# Patient Record
Sex: Male | Born: 1995 | Race: Black or African American | Hispanic: No | Marital: Single | Smoking: Never smoker
Health system: Southern US, Community
[De-identification: ages and names within clinical notes are randomized; demographics above are authoritative.]

## PROBLEM LIST (undated history)

## (undated) DIAGNOSIS — S93409A Sprain of unspecified ligament of unspecified ankle, initial encounter: Secondary | ICD-10-CM

## (undated) DIAGNOSIS — T7840XA Allergy, unspecified, initial encounter: Secondary | ICD-10-CM

## (undated) HISTORY — DX: Sprain of unspecified ligament of unspecified ankle, initial encounter: S93.409A

## (undated) HISTORY — DX: Allergy, unspecified, initial encounter: T78.40XA

## (undated) HISTORY — PX: CHALAZION EXCISION: SHX213

---
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
## 2013-02-21 ENCOUNTER — Ambulatory Visit: Payer: Self-pay | Admitting: Family Medicine

## 2013-08-23 ENCOUNTER — Emergency Department: Payer: Self-pay | Admitting: Emergency Medicine

## 2013-08-23 LAB — COMPREHENSIVE METABOLIC PANEL
Albumin: 4.2 g/dL (ref 3.8–5.6)
Alkaline Phosphatase: 142 U/L (ref 98–317)
Anion Gap: 3 — ABNORMAL LOW (ref 7–16)
BUN: 18 mg/dL (ref 9–21)
Bilirubin,Total: 0.5 mg/dL (ref 0.2–1.0)
Calcium, Total: 9.5 mg/dL (ref 9.0–10.7)
Chloride: 104 mmol/L (ref 97–107)
Co2: 30 mmol/L — ABNORMAL HIGH (ref 16–25)
Creatinine: 1.3 mg/dL (ref 0.60–1.30)
EGFR (African American): >60
EGFR (Non-African Amer.): >60
Glucose: 77 mg/dL (ref 65–99)
Osmolality: 275 (ref 275–301)
Potassium: 4.1 mmol/L (ref 3.3–4.7)
SGOT(AST): 40 U/L (ref 10–41)
SGPT (ALT): 24 U/L (ref 12–78)
Sodium: 137 mmol/L (ref 132–141)
Total Protein: 7.9 g/dL (ref 6.4–8.6)

## 2013-08-23 LAB — URINALYSIS, COMPLETE
Bacteria: NONE SEEN
Bilirubin,UR: NEGATIVE
Blood: NEGATIVE
Glucose,UR: NEGATIVE mg/dL (ref 0–75)
Ketone: NEGATIVE
Leukocyte Esterase: NEGATIVE
Nitrite: NEGATIVE
Ph: 6 (ref 4.5–8.0)
Protein: NEGATIVE
RBC,UR: <1 /HPF (ref 0–5)
Specific Gravity: 1.03 (ref 1.003–1.030)
Squamous Epithelial: NONE SEEN
WBC UR: <1 /HPF (ref 0–5)

## 2013-08-23 LAB — CBC
HCT: 43.9 % (ref 40.0–52.0)
MCHC: 33.4 g/dL (ref 32.0–36.0)
MCV: 83 fL (ref 80–100)
Platelet: 226 10*3/uL (ref 150–440)
RBC: 5.31 10*6/uL (ref 4.40–5.90)

## 2013-08-23 LAB — LIPASE, BLOOD: Lipase: 82 U/L (ref 73–393)

## 2014-02-16 ENCOUNTER — Ambulatory Visit: Payer: Self-pay | Admitting: Family Medicine

## 2014-02-27 ENCOUNTER — Ambulatory Visit (INDEPENDENT_AMBULATORY_CARE_PROVIDER_SITE_OTHER): Payer: BC Managed Care – PPO | Admitting: Physician Assistant

## 2014-02-27 VITALS — BP 110/62 | HR 95 | Temp 98.1°F | Resp 18 | Ht 76.5 in | Wt 178.0 lb

## 2014-02-27 DIAGNOSIS — J302 Other seasonal allergic rhinitis: Secondary | ICD-10-CM

## 2014-02-27 DIAGNOSIS — J309 Allergic rhinitis, unspecified: Secondary | ICD-10-CM

## 2014-02-27 DIAGNOSIS — J029 Acute pharyngitis, unspecified: Secondary | ICD-10-CM

## 2014-02-27 LAB — POCT CBC
Granulocyte percent: 74 %G (ref 37–80)
HCT, POC: 42.2 % — AB (ref 43.5–53.7)
HEMOGLOBIN: 13.6 g/dL — AB (ref 14.1–18.1)
Lymph, poc: 1.6 (ref 0.6–3.4)
MCH: 27.8 pg (ref 27–31.2)
MCHC: 32.2 g/dL (ref 31.8–35.4)
MCV: 86.3 fL (ref 80–97)
MID (CBC): 0.6 (ref 0–0.9)
MPV: 8.7 fL (ref 0–99.8)
PLATELET COUNT, POC: 192 10*3/uL (ref 142–424)
POC Granulocyte: 6.4 (ref 2–6.9)
POC LYMPH PERCENT: 18.8 %L (ref 10–50)
POC MID %: 7.2 %M (ref 0–12)
RBC: 4.89 M/uL (ref 4.69–6.13)
RDW, POC: 12.8 %
WBC: 8.6 10*3/uL (ref 4.6–10.2)

## 2014-02-27 LAB — POCT RAPID STREP A (OFFICE): RAPID STREP A SCREEN: NEGATIVE

## 2014-02-27 MED ORDER — METHYLPREDNISOLONE ACETATE 80 MG/ML IJ SUSP
80.0000 mg | Freq: Once | INTRAMUSCULAR | Status: AC
  Administered 2014-02-27: 80 mg via INTRAMUSCULAR

## 2014-02-27 MED ORDER — FLUTICASONE PROPIONATE 50 MCG/ACT NA SUSP: 2.0000 | Freq: Every day | NASAL | Status: DC | PRN

## 2014-02-27 NOTE — Progress Notes (Signed)
Subjective:    Patient ID: Jared Estrada, male    DOB: 1996-03-08, 18 y.o.   MRN: [id number]  HPI Pt presents to clinic with sore throat for the last 3 days in the am only.  He is also having some neck pain that is worse with movement but no headache. His neck almost feels like a pulled muscle but he has not had an injury other than a foot ball injury about 2 weeks ago.  He is really congested and sometimes will have yellow rhinorrhea.  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  He has seasonal allergies that do not seem worse than normal for him.  OTC meds - Tylenol - does not feel like it helped.  [employer] football student - cleared for a concussion that happened 2 weeks ago but the school athletic trainer  Review of Systems  Constitutional: Negative for fever and chills.  HENT: Positive for congestion, postnasal drip and rhinorrhea (yellow).   Respiratory: Negative for cough.   Musculoskeletal: Positive for neck pain. Negative for neck stiffness.  Allergic/Immunologic: Positive for environmental allergies.  Neurological: Negative for headaches.       Objective:   Physical Exam  Vitals reviewed. Constitutional: He is oriented to person, place, and time. He appears well-developed and well-nourished.  HENT:  Head: Normocephalic and atraumatic.  Right Ear: Hearing, tympanic membrane, external ear and ear canal normal.  Left Ear: Hearing, tympanic membrane, external ear and ear canal normal.  Nose: Mucosal edema (pale and right side turbinates are touching the septum) and rhinorrhea (clear) present.  Mouth/Throat: Oropharynx is clear and moist.  PND visible  Eyes: Conjunctivae are normal.  Neck: Normal range of motion. Neck supple.  Cardiovascular: Normal rate, regular rhythm and normal heart sounds.   No murmur heard. Pulmonary/Chest: Effort normal and breath sounds normal. He has no wheezes.  Lymphadenopathy:    He has no cervical adenopathy.  Neurological: He is alert  and oriented to person, place, and time.  Skin: Skin is warm and dry.  Psychiatric: He has a normal mood and affect. His behavior is normal. Judgment and thought content normal.   Results for orders placed in visit on 02/27/14  POCT RAPID STREP A (OFFICE)      Result Value Ref Range   Rapid Strep A Screen Negative  Negative  POCT CBC      Result Value Ref Range   WBC 8.6  4.6 - 10.2 K/uL   Lymph, poc 1.6  0.6 - 3.4   POC LYMPH PERCENT 18.8  10 - 50 %L   MID (cbc) 0.6  0 - 0.9   POC MID % 7.2  0 - 12 %M   POC Granulocyte 6.4  2 - 6.9   Granulocyte percent 74.0  37 - 80 %G   RBC 4.89  4.69 - 6.13 M/uL   Hemoglobin 13.6 (*) 14.1 - 18.1 g/dL   HCT, POC 78.242.2 (*) 95.643.5 - 53.7 %   MCV 86.3  80 - 97 fL   MCH, POC 27.8  27 - 31.2 pg   MCHC 32.2  31.8 - 35.4 g/dL   RDW, POC 21.312.8     Platelet Count, POC 192  142 - 424 K/uL   MPV 8.7  0 - 99.8 fL       Assessment & Plan:  Sore throat - Plan: POCT rapid strep A, POCT CBC  Seasonal allergies - Plan: fluticasone (FLONASE) 50 MCG/ACT nasal spray, methylPREDNISolone acetate (DEPO-MEDROL)  injection 80 mg  Pt has significant under treated allergies - we will restart the Flonase and give him DepoMedrol tonight because of his significant congestion.  He will f/u if there are any changes with his other symptoms.   Benny LennertSarah Weber PA-C  [facility] 02/27/2014 6:35 PM

## 2014-02-27 NOTE — Patient Instructions (Signed)
Motrin 200mg  take 4 pills up to 3x/day. Rest and push fluids.

## 2014-07-04 ENCOUNTER — Ambulatory Visit (INDEPENDENT_AMBULATORY_CARE_PROVIDER_SITE_OTHER): Payer: BC Managed Care – PPO | Admitting: Family Medicine

## 2014-07-04 ENCOUNTER — Ambulatory Visit (INDEPENDENT_AMBULATORY_CARE_PROVIDER_SITE_OTHER): Payer: BC Managed Care – PPO

## 2014-07-04 VITALS — BP 100/68 | HR 58 | Temp 98.0°F | Resp 16 | Ht 76.0 in | Wt 180.0 lb

## 2014-07-04 DIAGNOSIS — M20029 Boutonniere deformity of unspecified finger(s): Secondary | ICD-10-CM

## 2014-07-04 DIAGNOSIS — M79644 Pain in right finger(s): Secondary | ICD-10-CM

## 2014-07-04 DIAGNOSIS — M79609 Pain in unspecified limb: Secondary | ICD-10-CM

## 2014-07-04 DIAGNOSIS — M20021 Boutonniere deformity of right finger(s): Secondary | ICD-10-CM

## 2014-07-04 MED ORDER — MELOXICAM 15 MG PO TABS: 15.0000 mg | ORAL_TABLET | Freq: Every day | ORAL | Status: DC

## 2014-07-04 NOTE — Patient Instructions (Addendum)
Keep the splint on. If you need to remove it, use your other hand to keep the joint extended. Every day, you need to exercise the joint at the end of your finger-if you don't, it will become stiff, and you wont be able to move it.  You should not return to play until released by the hand specialist. Take the CD with the x-rays we took with you for the specialist to view.

## 2014-07-04 NOTE — Progress Notes (Signed)
   Subjective:    Patient ID: Jared Estrada, male    DOB: 10/07/1996, 18 y.o.   MRN: [id number]   PCP: No primary provider on file.  Chief Complaint  Patient presents with  . Finger Injury    Right Pinky Finger-Hurt playing football 3 weeks ago    Medications, allergies, past medical history, surgical history, family history, social history and problem list reviewed and updated.  HPI  This 18 y.o. male presents for evaluation of RIGHT 5th finger injury sustained 3 weeks ago when he caught a football near the ground and rolled. He was evaluated by the AT, and has been icing it. He has continued his participation in FB and his other regular activities. He has pain and reduced ROM.  His mother saw it today and is concerned that it looks so deformed. He had a similar injury in high school, but not this bad.  RIGHT hand dominant.   Review of Systems As above.    Objective:   Physical Exam  Constitutional: He is oriented to person, place, and time. He appears well-developed and well-nourished.  BP 100/68  Pulse 58  Temp(Src) 98 F (36.7 C) (Oral)  Resp 16  Ht  (1.93 m)  Wt 180 lb (81.647 kg)  BMI 21.92 kg/m2  SpO2 100%   Pulmonary/Chest: Effort normal.  Musculoskeletal:       Right hand: He exhibits decreased range of motion, tenderness, bony tenderness, deformity and swelling. He exhibits normal capillary refill and no laceration. Normal sensation noted.       Left hand: Normal.       Hands: 30-40% flexion of the RIGHT 5th finger DIP, compared to 90% on the LEFT. Unable to extend the RIGHT PIP, though some baseline deformity was present to unknown degree.   Neurological: He is alert and oriented to person, place, and time.  Skin: Skin is warm and dry.  Psychiatric: He has a normal mood and affect. His speech is normal and behavior is normal.    RIGHT 5th FINGER: [facility] reading (PRIMARY) by  Dr. Neva Seat. 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        Assessment & Plan:  1. Finger pain, right - DG Finger Little Right; Future - meloxicam (MOBIC) 15 MG tablet; Take 1 tablet (15 mg total) by mouth daily.  Dispense: 30 tablet; Refill: 1  2. Central slip extensor tendon injury (boutonniere), right Splint placed and educated on the need to keep it in place. If he needs to remove it, he needs to maintain extension of the PIP. In addition, he needs to work on ROM of the DIP, as he has already lost significant flexion. Return to play to be determined by Hand. - Ambulatory referral to Hand Surgery  Also examined by Dr. Neva Seat.   Fernande Bras, PA-C Physician Assistant-Certified [facility]

## 2014-07-04 NOTE — Progress Notes (Signed)
Xray read and patient discussed with Porfirio Oar, PA-C. Agree with assessment and plan of care per her note. RTP to be determined by team physician and under care of athletic training staff, but recommended ortho eval as in her note.

## 2014-11-01 ENCOUNTER — Ambulatory Visit (INDEPENDENT_AMBULATORY_CARE_PROVIDER_SITE_OTHER): Payer: BLUE CROSS/BLUE SHIELD | Admitting: Family Medicine

## 2014-11-01 ENCOUNTER — Ambulatory Visit (INDEPENDENT_AMBULATORY_CARE_PROVIDER_SITE_OTHER): Payer: BLUE CROSS/BLUE SHIELD

## 2014-11-01 VITALS — BP 105/68 | HR 83 | Temp 98.2°F | Resp 18 | Ht 76.0 in | Wt 179.6 lb

## 2014-11-01 DIAGNOSIS — M25562 Pain in left knee: Secondary | ICD-10-CM

## 2014-11-01 NOTE — Progress Notes (Addendum)
Subjective:    Patient ID: Jared Estrada, male    DOB: 1996-05-05, 19 y.o.   MRN: [id number] This chart was scribed for [name], MD by Jolene Provost, Medical Scribe. This patient was seen in Room 9 and the patient's care was started a 12:15 PM.   HPI  Pt goes by [name].  HPI Comments: [name] is a 19 y.o. male who presents to [facility] complaining of left knee pain after a fall last night. Pt states he lost his footing in a building and fell directly on the front of the left knee. Pt states he was not able to ambulate immediately after he fell. Pt states he was able to put weight on the leg after 15-20 minutes. Pt states he has pain with flexion and extension of left knee, worse with extension. Pt denies injury or surgery on the knee in the past, or current weakness or numbness. No attempted treatments.   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 Practice starts tomorrow.   Has crutches at home.   There are no active problems to display for this patient.  Past Medical History  Diagnosis Date  . Allergy   . Ankle sprain     bilateral, L 12/2011, R 04/2011   Past Surgical History  Procedure Laterality Date  . Chalazion excision     No Known Allergies Prior to Admission medications   Medication Sig Start Date End Date Taking? Authorizing Provider  acetaminophen (TYLENOL) 500 MG tablet Take 500 mg by mouth every 6 (six) hours as needed.   Yes Historical Provider, MD  cetirizine (ZYRTEC) 10 MG tablet Take 10 mg by mouth daily as needed. For seasonal allergies   Yes Historical Provider, MD  fluticasone (FLONASE) 50 MCG/ACT nasal spray Place 2 sprays into both nostrils daily as needed. For seasonal allergies 02/27/14  Yes Morrell Riddle, PA-C  meloxicam (MOBIC) 15 MG tablet Take 1 tablet (15 mg total) by mouth daily. 07/04/14  Yes Chelle Tessa Lerner, PA-C   History   Social History  . Marital Status: Single    Spouse Name: n/a    Number of Children: 0  . Years of Education: N/A    Occupational History  . student     [employer]   Social History Main Topics  . Smoking status: Never Smoker   . Smokeless tobacco: Never Used  . Alcohol Use: No  . Drug Use: No  . Sexual Activity: Not on file   Other Topics Concern  . Not on file   Social History Narrative   Graduated from [employer].  Attends [employer], where he plays for the football team.     Review of Systems  Musculoskeletal: Positive for joint swelling, arthralgias and gait problem.  Skin: Negative for color change and wound.  Neurological: Negative for weakness and numbness.       Objective:   Physical Exam  Constitutional: He is oriented to person, place, and time. He appears well-developed and well-nourished.  HENT:  Head: Normocephalic and atraumatic.  Eyes: Pupils are equal, round, and reactive to light.  Neck: No JVD present.  Cardiovascular: Normal rate and regular rhythm.   Pulmonary/Chest: Effort normal and breath sounds normal. No respiratory distress.  Musculoskeletal:  Skin is intact without apparent effusion. Some soft tissue swelling below patella on left knee. Full extension but limited to 90 degrees of flexion. Patella is non tender. Patella tendon and soft tissue anteriorly is non tender. Pt is able to hold  a straight leg raise. Negative Valgus and Varus. Some slight tenderness along distal lateral hamstring, without defect. Negative Lachman. Pain with Mcmurray laterally, without click.   Neurological: He is alert and oriented to person, place, and time.  Skin: Skin is warm and dry.  Psychiatric: He has a normal mood and affect. His behavior is normal.  Nursing note and vitals reviewed.   Filed Vitals:   11/01/14 1133  BP: 105/68  Pulse: 83  Temp: 98.2 F (36.8 C)  TempSrc: Oral  Resp: 18  Height:  (1.93 m)  Weight: 179 lb 9.6 oz (81.466 kg)  SpO2: 99%   [facility] reading (PRIMARY) by  Dr. Neva Seat: L knee: no acute bony findings or fracture seen.        Assessment & Plan:   ELBIE Estrada is a 19 y.o. male Left knee pain - Plan: DG Knee Complete 4 Views Left  initial hx consistent with patella/infrapatellar contusion. Some lateral/posterolateral pain, but varus testing ok. May have had hamstring strain.  No significant effusion in office.   -trial of hinged knee brace and has crutches at home if instability sx's or pain with WB.    -R.I.C.E. treatment discussed, advil if needed otc. Follow up with school's ATC/team physician this week or recheck with me in next week to 10 days. Sooner if worse.   No orders of the defined types were placed in this encounter.   Patient Instructions  You appear to have a contusion (bruising) of the front of your knee at this point, but may have pulled hamstring with the pain in the back of your knee. Wear the hinged brace, ice on and off for 15 minutes at a time for next few days, crutches if knee feels unstable or pain with putting weight on it, and recheck with myself or your team physician in the next week. Return to the clinic or go to the nearest emergency room if any of your symptoms worsen or new symptoms occur.  RICE: Routine Care for Injuries The routine care of many injuries includes Rest, Ice, Compression, and Elevation (RICE). HOME CARE INSTRUCTIONS  Rest is needed to allow your body to heal. Routine activities can usually be resumed when comfortable. Injured tendons and bones can take up to 6 weeks to heal. Tendons are the cord-like structures that attach muscle to bone.  Ice following an injury helps keep the swelling down and reduces pain.  Put ice in a plastic bag.  Place a towel between your skin and the bag.  Leave the ice on for 15-20 minutes, 3-4 times a day, or as directed by your health care provider. Do this while awake, for the first 24 to 48 hours. After that, continue as directed by your caregiver.  Compression helps keep swelling down. It also gives support and helps with  discomfort. If an elastic bandage has been applied, it should be removed and reapplied every 3 to 4 hours. It should not be applied tightly, but firmly enough to keep swelling down. Watch fingers or toes for swelling, bluish discoloration, coldness, numbness, or excessive pain. If any of these problems occur, remove the bandage and reapply loosely. Contact your caregiver if these problems continue.  Elevation helps reduce swelling and decreases pain. With extremities, such as the arms, hands, legs, and feet, the injured area should be placed near or above the level of the heart, if possible. SEEK IMMEDIATE MEDICAL CARE IF:  You have persistent pain and swelling.  You develop  redness, numbness, or unexpected weakness.  Your symptoms are getting worse rather than improving after several days. These symptoms may indicate that further evaluation or further X-rays are needed. Sometimes, X-rays may not show a small broken bone (fracture) until 1 week or 10 days later. Make a follow-up appointment with your caregiver. Ask when your X-ray results will be ready. Make sure you get your X-ray results. Document Released: 01/28/2001 Document Revised: 10/21/2013 Document Reviewed: 03/17/2011 [facility] Patient Information 2015 [place], [place]. This information is not intended to replace advice given to you by your health care provider. Make sure you discuss any questions you have with your health care provider.       I personally performed the services described in this documentation, which was scribed in my presence. The recorded information has been reviewed and considered, and addended by me as needed.

## 2014-11-01 NOTE — Patient Instructions (Signed)
You appear to have a contusion (bruising) of the front of your knee at this point, but may have pulled hamstring with the pain in the back of your knee. Wear the hinged brace, ice on and off for 15 minutes at a time for next few days, crutches if knee feels unstable or pain with putting weight on it, and recheck with myself or your team physician in the next week. Return to the clinic or go to the nearest emergency room if any of your symptoms worsen or new symptoms occur.  RICE: Routine Care for Injuries The routine care of many injuries includes Rest, Ice, Compression, and Elevation (RICE). HOME CARE INSTRUCTIONS  Rest is needed to allow your body to heal. Routine activities can usually be resumed when comfortable. Injured tendons and bones can take up to 6 weeks to heal. Tendons are the cord-like structures that attach muscle to bone.  Ice following an injury helps keep the swelling down and reduces pain.  Put ice in a plastic bag.  Place a towel between your skin and the bag.  Leave the ice on for 15-20 minutes, 3-4 times a day, or as directed by your health care provider. Do this while awake, for the first 24 to 48 hours. After that, continue as directed by your caregiver.  Compression helps keep swelling down. It also gives support and helps with discomfort. If an elastic bandage has been applied, it should be removed and reapplied every 3 to 4 hours. It should not be applied tightly, but firmly enough to keep swelling down. Watch fingers or toes for swelling, bluish discoloration, coldness, numbness, or excessive pain. If any of these problems occur, remove the bandage and reapply loosely. Contact your caregiver if these problems continue.  Elevation helps reduce swelling and decreases pain. With extremities, such as the arms, hands, legs, and feet, the injured area should be placed near or above the level of the heart, if possible. SEEK IMMEDIATE MEDICAL CARE IF:  You have persistent  pain and swelling.  You develop redness, numbness, or unexpected weakness.  Your symptoms are getting worse rather than improving after several days. These symptoms may indicate that further evaluation or further X-rays are needed. Sometimes, X-rays may not show a small broken bone (fracture) until 1 week or 10 days later. Make a follow-up appointment with your caregiver. Ask when your X-ray results will be ready. Make sure you get your X-ray results. Document Released: 01/28/2001 Document Revised: 10/21/2013 Document Reviewed: 03/17/2011 [facility] Patient Information 2015 [place], [place]. This information is not intended to replace advice given to you by your health care provider. Make sure you discuss any questions you have with your health care provider.

## 2015-01-28 ENCOUNTER — Ambulatory Visit: Admit: 2015-01-28 | Disposition: A | Discharge status: Home / Self Care | Payer: Self-pay | Admitting: Family Medicine

## 2016-07-14 IMAGING — CR DG KNEE COMPLETE 4+V*L*
1 series · 4 of 4 positions shown · non-contrast
Comparison: None.

CLINICAL DATA: Football injury.  Initial evaluation.

EXAM:
LEFT KNEE - COMPLETE 4+ VIEW

[Series 1: kdxr knee lt comp with obliques · 0.14mm/px · 4 of 4 slices shown]
[im 1/4]
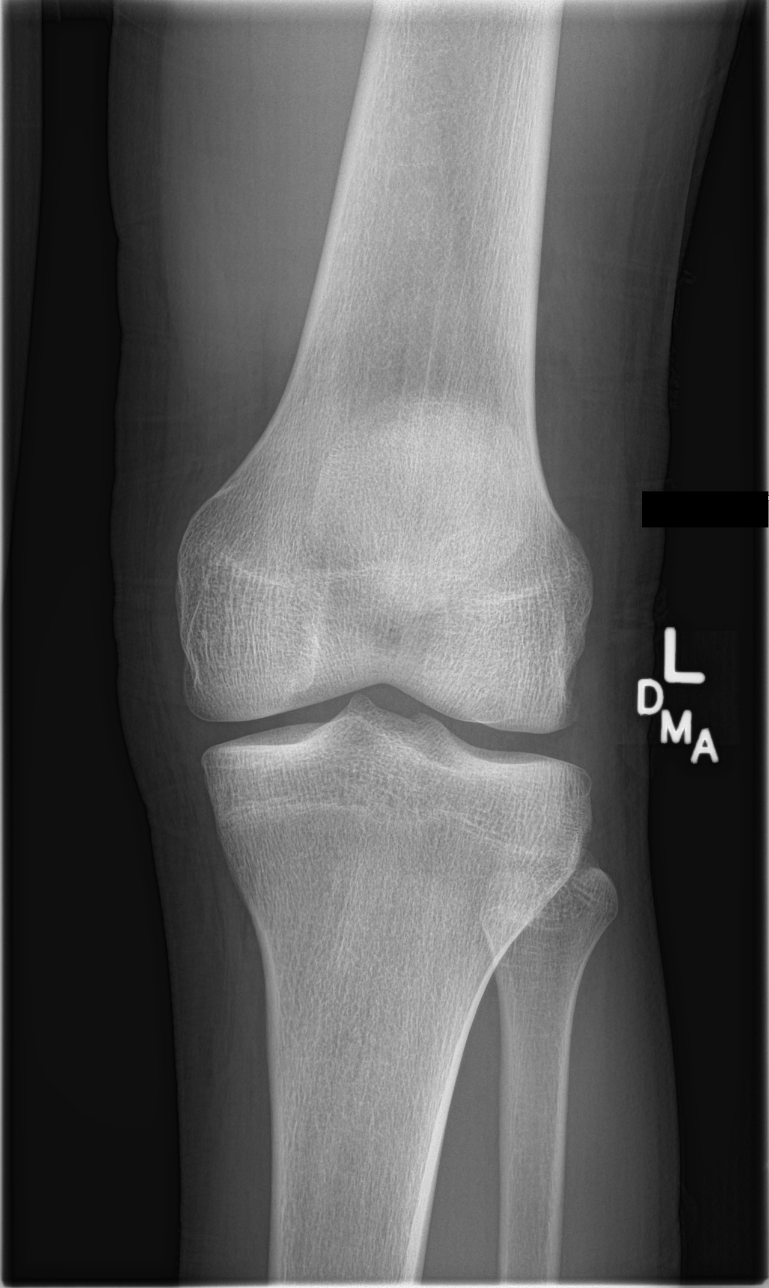
[im 2/4]
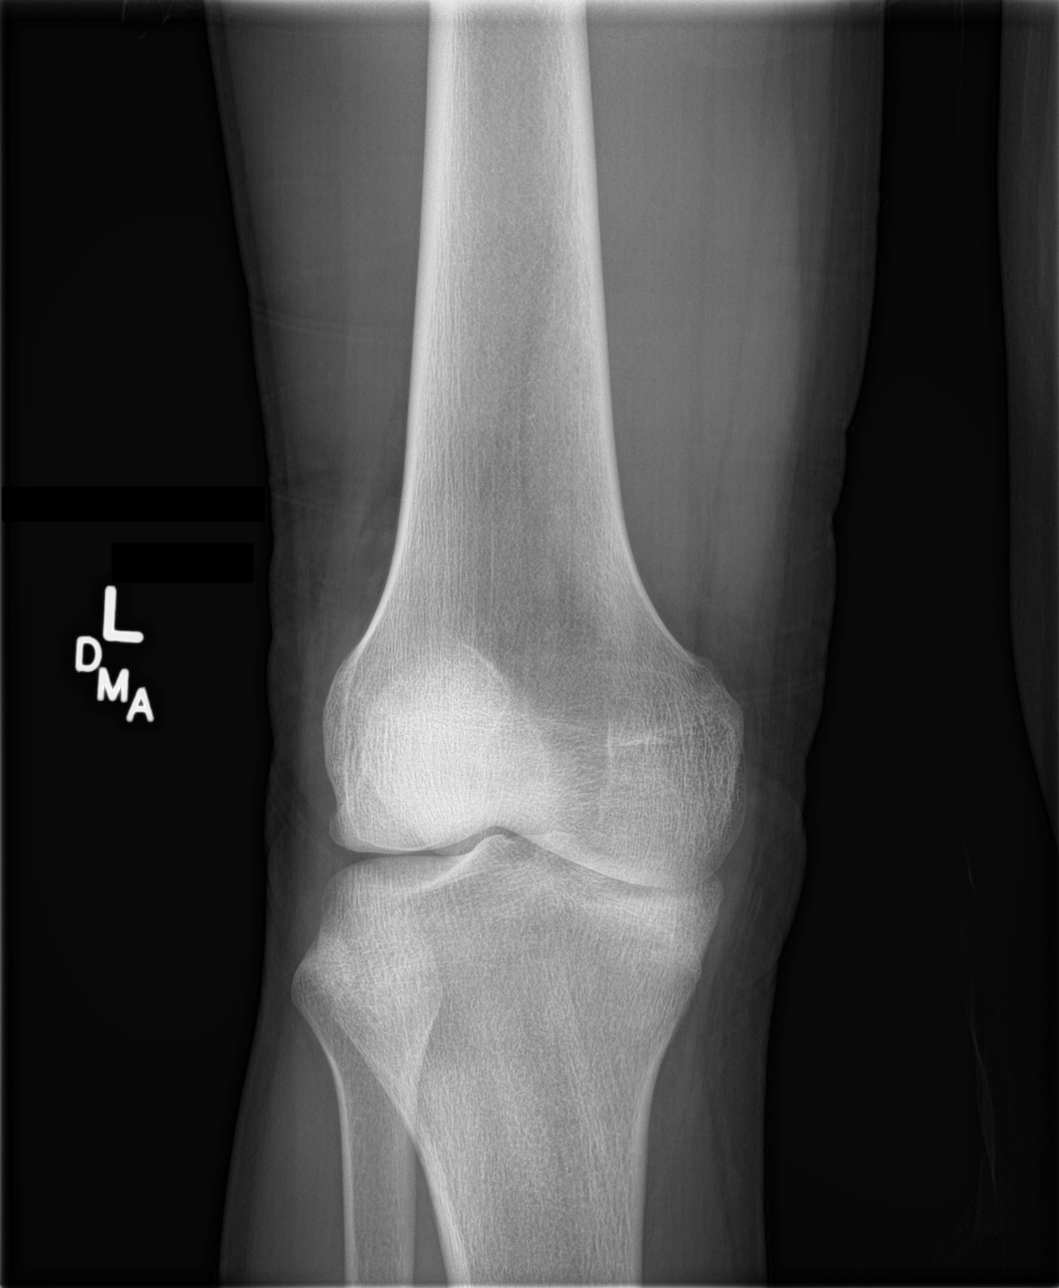
[im 3/4]
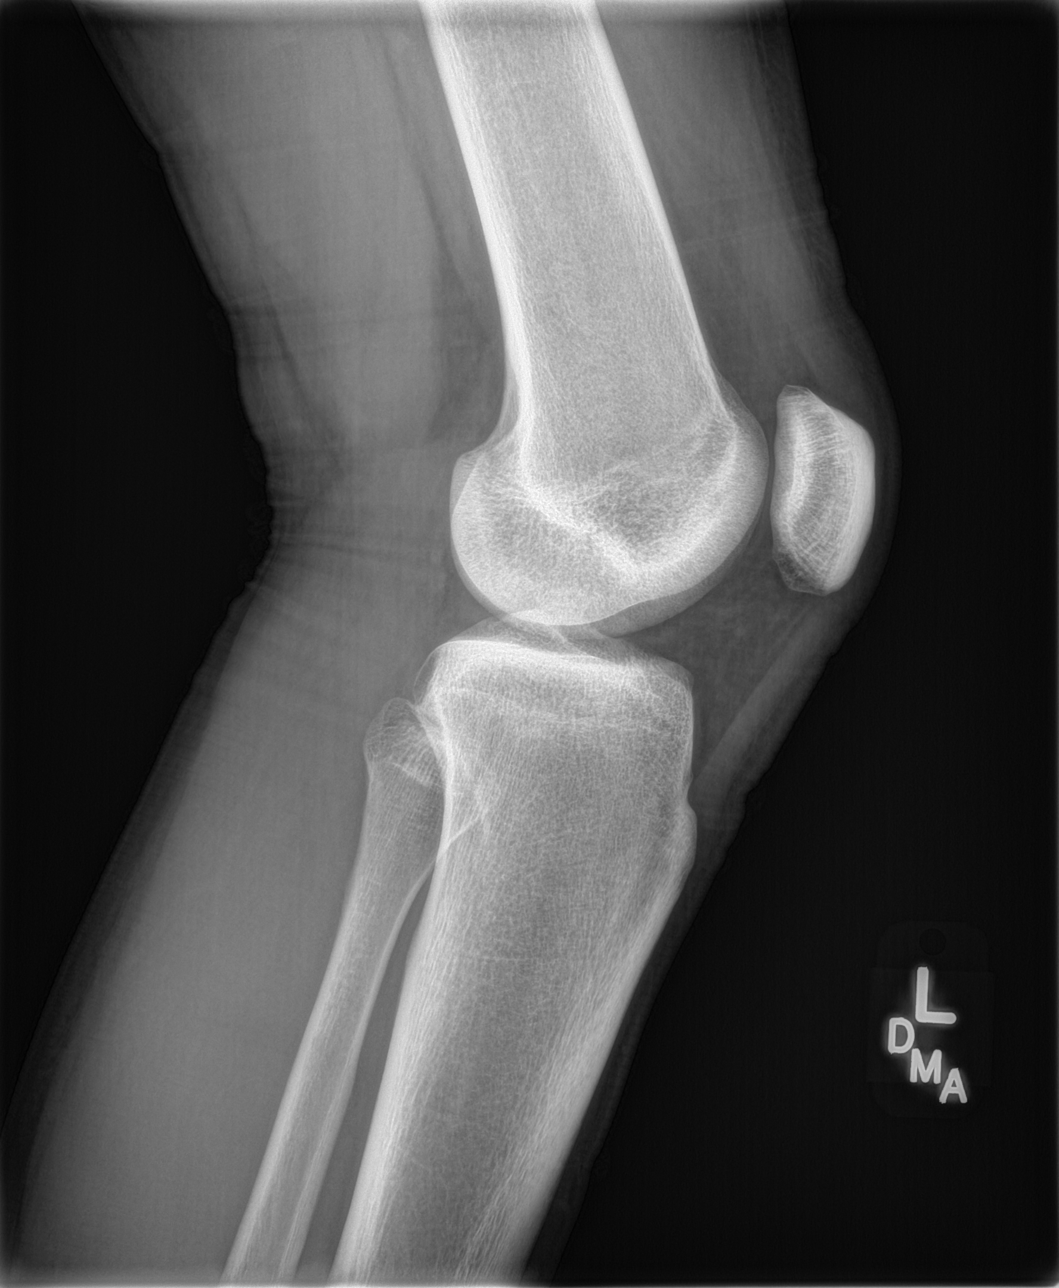
[im 4/4]
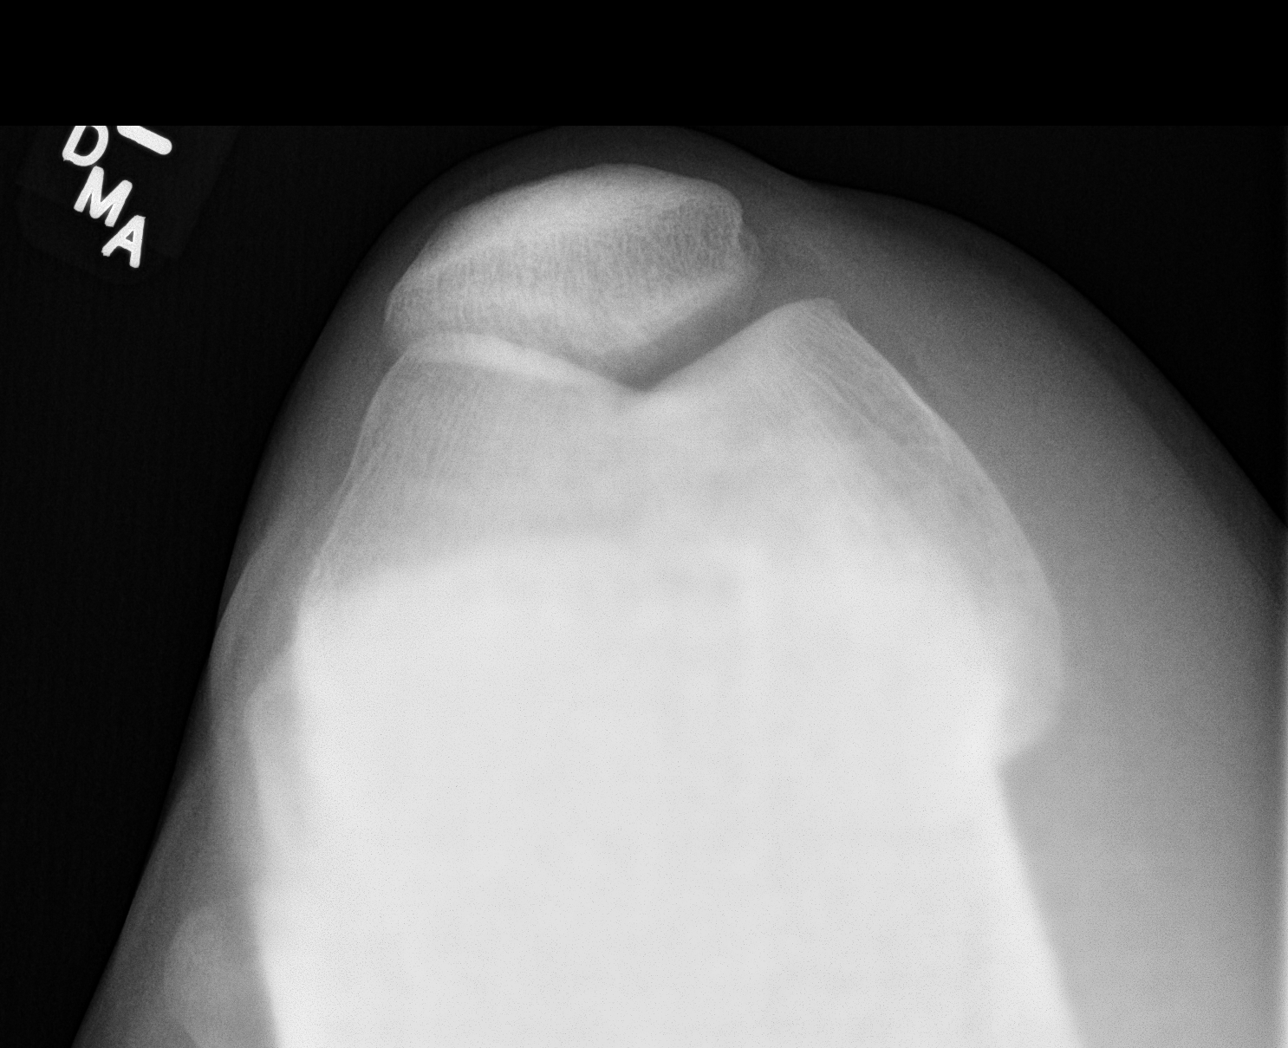

[4 of 4 positions shown; findings below may reference images not displayed]

FINDINGS: There is no evidence of fracture, dislocation, or joint effusion.
There is no evidence of arthropathy or other focal bone abnormality.
Soft tissues are unremarkable.
IMPRESSION: Negative.

## 2017-04-12 ENCOUNTER — Ambulatory Visit (INDEPENDENT_AMBULATORY_CARE_PROVIDER_SITE_OTHER): Payer: BLUE CROSS/BLUE SHIELD | Admitting: Family Medicine

## 2017-04-12 ENCOUNTER — Encounter: Payer: Self-pay | Admitting: Family Medicine

## 2017-04-12 VITALS — BP 118/68 | HR 60 | Temp 97.9°F | Resp 16 | Ht 76.25 in | Wt 185.8 lb

## 2017-04-12 DIAGNOSIS — Z113 Encounter for screening for infections with a predominantly sexual mode of transmission: Secondary | ICD-10-CM

## 2017-04-12 DIAGNOSIS — Z0000 Encounter for general adult medical examination without abnormal findings: Secondary | ICD-10-CM

## 2017-04-12 DIAGNOSIS — Z23 Encounter for immunization: Secondary | ICD-10-CM

## 2017-04-12 NOTE — Progress Notes (Signed)
Chief Complaint  Patient presents with  . Annual Exam    Subjective:  Jared Estrada is a 21 y.o. male here for a health maintenance visit.  Patient is established pt  There are no active problems to display for this patient.   Past Medical History:  Diagnosis Date  . Allergy   . Ankle sprain    bilateral, L 12/2011, R 04/2011    Past Surgical History:  Procedure Laterality Date  . CHALAZION EXCISION       Outpatient Medications Prior to Visit  Medication Sig Dispense Refill  . acetaminophen (TYLENOL) 500 MG tablet Take 500 mg by mouth every 6 (six) hours as needed.    . cetirizine (ZYRTEC) 10 MG tablet Take 10 mg by mouth daily as needed. For seasonal allergies    . fluticasone (FLONASE) 50 MCG/ACT nasal spray Place 2 sprays into both nostrils daily as needed. For seasonal allergies 16 g 5  . meloxicam (MOBIC) 15 MG tablet Take 1 tablet (15 mg total) by mouth daily. 30 tablet 1   No facility-administered medications prior to visit.     No Known Allergies   Family History  Problem Relation Age of Onset  . Arthritis Sister        right shoulder, s/p injury  . Cancer Maternal Grandmother        breast  . Hypertension Maternal Grandmother   . Alcohol abuse Maternal Grandfather   . Hyperlipidemia Maternal Grandfather   . Alcohol abuse Paternal Grandfather   . Hypertension Father      Health Habits: Dental Exam: up to date Eye Exam: up to date Exercise: 0 times/week on average Current exercise activities: walking/running Diet: balanced  Social History   Social History  . Marital status: Single    Spouse name: n/a  . Number of children: 0  . Years of education: N/A   Occupational History  . student     Jared Estrada   Social History Main Topics  . Smoking status: Never Smoker  . Smokeless tobacco: Never Used  . Alcohol use No  . Drug use: No  . Sexual activity: Not on file   Other Topics Concern  . Not on file   Social History Narrative   Graduated from [employer].  Attends [employer], where he plays for the football team.   History  Alcohol Use No   History  Smoking Status  . Never Smoker  Smokeless Tobacco  . Never Used   History  Drug Use No    Sexual Health Sexually active: with male partner Current contraception: condoms  Health Maintenance: See under health Maintenance activity for review of completion dates as well. Immunization History  Administered Date(s) Administered  . Tdap 03/27/2007, 04/12/2017    Depression Screen-PHQ2/9 Depression screen PHQ 2/9 04/12/2017  Decreased Interest 0  Down, Depressed, Hopeless 0  PHQ - 2 Score 0     Depression Severity and Treatment Recommendations:  0-4= None  5-9= Mild / Treatment: Support, educate to call if worse; return in one month  10-14= Moderate / Treatment: Support, watchful waiting; Antidepressant or Psycotherapy  15-19= Moderately severe / Treatment: Antidepressant OR Psychotherapy  >= 20 = Major depression, severe / Antidepressant AND Psychotherapy    Review of Systems   ROS  See HPI for ROS as well.    Objective:   Vitals:   04/12/17 0943  BP: 118/68  Pulse: 60  Resp: 16  Temp: 97.9 F (36.6 C)  TempSrc: Oral  SpO2:  100%  Weight: 185 lb 12.8 oz (84.3 kg)  Height: 6' 4.25" (1.937 m)   Body mass index is 22.47 kg/m.  Physical Exam  Constitutional: He is oriented to person, place, and time. He appears well-developed and well-nourished.  HENT:  Head: Normocephalic and atraumatic.  Right Ear: External ear normal.  Left Ear: External ear normal.  Nose: Nose normal.  Mouth/Throat: Oropharynx is clear and moist.  Eyes: Conjunctivae and EOM are normal. Pupils are equal, round, and reactive to light.  Neck: Normal range of motion. Neck supple. No thyromegaly present.  Cardiovascular: Normal rate, regular rhythm and normal heart sounds.   No murmur heard. Pulmonary/Chest: Effort normal and breath sounds normal. No  respiratory distress. He has no wheezes. He has no rales.  Abdominal: Soft. Bowel sounds are normal. He exhibits no distension and no mass. There is no tenderness. There is no rebound and no guarding.  Musculoskeletal: Normal range of motion. He exhibits no edema.  Neurological: He is alert and oriented to person, place, and time. He has normal reflexes.  Skin: Skin is warm. No erythema.  Psychiatric: He has a normal mood and affect. His behavior is normal. Judgment and thought content normal.       Assessment/Plan:   Patient was seen for a health maintenance exam.  Counseled the patient on health maintenance issues. Reviewed her health mainteance schedule and ordered appropriate tests (see orders.) Counseled on regular exercise and weight management. Recommend regular eye exams and dental cleaning.   The following issues were addressed today for health maintenance:   [name] was seen today for annual exam.  Diagnoses and all orders for this visit:  Encounter for health maintenance examination in adult  Screen for STD (sexually transmitted disease)- verbal consent given for HIV -     GC/Chlamydia Probe Amp -     HIV antibody -     Hepatitis B surface antigen -     RPR  Need for Tdap vaccination  Other orders -     Tdap vaccine greater than or equal to 7yo IM    Return in about 1 year (around 04/12/2018).    Body mass index is 22.47 kg/m.:  Discussed the patient's BMI with patient. The BMI body mass index is 22.47 kg/m.     No future appointments.  Patient Instructions       IF you received an x-ray today, you will receive an invoice from Jared Estrada. Please contact Jared Estrada at [phone] with questions or concerns regarding your invoice.   IF you received labwork today, you will receive an invoice from Jared Estrada. Please contact Jared Estrada at [phone] with questions or concerns regarding your invoice.   Our billing staff will not be able  to assist you with questions regarding bills from these companies.  You will be contacted with the lab results as soon as they are available. The fastest way to get your results is to activate your My Chart account. Instructions are located on the last page of this paperwork. If you have not heard from [place] regarding the results in 2 weeks, please contact this office.

## 2017-04-12 NOTE — Patient Instructions (Signed)
     IF you received an x-ray today, you will receive an invoice from [facility]. Please contact [facility] at [phone] with questions or concerns regarding your invoice.   IF you received labwork today, you will receive an invoice from [facility]. Please contact [facility] at [phone] with questions or concerns regarding your invoice.   Our billing staff will not be able to assist you with questions regarding bills from these companies.  You will be contacted with the lab results as soon as they are available. The fastest way to get your results is to activate your My Chart account. Instructions are located on the last page of this paperwork. If you have not heard from us regarding the results in 2 weeks, please contact this office.     

## 2017-04-13 LAB — HEPATITIS B SURFACE ANTIGEN: Hepatitis B Surface Ag: NEGATIVE

## 2017-04-13 LAB — RPR: RPR Ser Ql: NONREACTIVE

## 2017-04-13 LAB — GC/CHLAMYDIA PROBE AMP
Chlamydia trachomatis, NAA: NEGATIVE
NEISSERIA GONORRHOEAE BY PCR: NEGATIVE

## 2017-04-13 LAB — HIV ANTIBODY (ROUTINE TESTING W REFLEX): HIV Screen 4th Generation wRfx: NONREACTIVE

## 2017-07-05 ENCOUNTER — Ambulatory Visit: Payer: BLUE CROSS/BLUE SHIELD | Admitting: Family Medicine

## 2017-07-05 NOTE — Progress Notes (Deleted)
  No chief complaint on file.   HPI  Past Medical History:  Diagnosis Date  . Allergy   . Ankle sprain    bilateral, L 12/2011, R 04/2011    Current Outpatient Prescriptions  Medication Sig Dispense Refill  . acetaminophen (TYLENOL) 500 MG tablet Take 500 mg by mouth every 6 (six) hours as needed.    . cetirizine (ZYRTEC) 10 MG tablet Take 10 mg by mouth daily as needed. For seasonal allergies    . fluticasone (FLONASE) 50 MCG/ACT nasal spray Place 2 sprays into both nostrils daily as needed. For seasonal allergies 16 g 5  . meloxicam (MOBIC) 15 MG tablet Take 1 tablet (15 mg total) by mouth daily. 30 tablet 1   No current facility-administered medications for this visit.     Allergies: No Known Allergies  Past Surgical History:  Procedure Laterality Date  . CHALAZION EXCISION      Social History   Social History  . Marital status: Single    Spouse name: n/a  . Number of children: 0  . Years of education: N/A   Occupational History  . student     [employer]   Social History Main Topics  . Smoking status: Never Smoker  . Smokeless tobacco: Never Used  . Alcohol use No  . Drug use: No  . Sexual activity: Not on file   Other Topics Concern  . Not on file   Social History Narrative   Graduated from [employer].  Attends [employer], where he plays for the football team.    ROS  Objective: There were no vitals filed for this visit.  Physical Exam  Assessment and Plan There are no diagnoses linked to this encounter.   Delores P [employer]

## 2019-02-24 DIAGNOSIS — H9192 Unspecified hearing loss, left ear: Secondary | ICD-10-CM | POA: Diagnosis not present

## 2019-02-24 DIAGNOSIS — H6122 Impacted cerumen, left ear: Secondary | ICD-10-CM | POA: Diagnosis not present

## 2019-03-31 DIAGNOSIS — Z20828 Contact with and (suspected) exposure to other viral communicable diseases: Secondary | ICD-10-CM | POA: Diagnosis not present

## 2019-05-21 ENCOUNTER — Other Ambulatory Visit: Payer: Self-pay

## 2019-05-21 ENCOUNTER — Encounter: Payer: Self-pay | Admitting: Nurse Practitioner

## 2019-05-21 ENCOUNTER — Ambulatory Visit (INDEPENDENT_AMBULATORY_CARE_PROVIDER_SITE_OTHER): Payer: BC Managed Care – PPO | Admitting: Nurse Practitioner

## 2019-05-21 VITALS — BP 124/72 | HR 75 | Temp 97.5°F | Ht 76.0 in | Wt 186.4 lb

## 2019-05-21 DIAGNOSIS — Z13220 Encounter for screening for lipoid disorders: Secondary | ICD-10-CM | POA: Diagnosis not present

## 2019-05-21 DIAGNOSIS — Z0001 Encounter for general adult medical examination with abnormal findings: Secondary | ICD-10-CM

## 2019-05-21 DIAGNOSIS — R002 Palpitations: Secondary | ICD-10-CM | POA: Diagnosis not present

## 2019-05-21 DIAGNOSIS — Z87820 Personal history of traumatic brain injury: Secondary | ICD-10-CM | POA: Insufficient documentation

## 2019-05-21 DIAGNOSIS — Z136 Encounter for screening for cardiovascular disorders: Secondary | ICD-10-CM

## 2019-05-21 HISTORY — DX: Palpitations: R00.2

## 2019-05-21 LAB — CBC
HCT: 42.4 % (ref 39.0–52.0)
Hemoglobin: 14 g/dL (ref 13.0–17.0)
MCHC: 32.9 g/dL (ref 30.0–36.0)
MCV: 91.5 fl (ref 78.0–100.0)
Platelets: 214 10*3/uL (ref 150.0–400.0)
RBC: 4.64 Mil/uL (ref 4.22–5.81)
RDW: 13.8 % (ref 11.5–15.5)
WBC: 5 10*3/uL (ref 4.0–10.5)

## 2019-05-21 LAB — LIPID PANEL
Cholesterol: 143 mg/dL (ref 0–200)
HDL: 42 mg/dL (ref >39.00)
LDL Cholesterol: 87 mg/dL (ref 0–99)
NonHDL: 101.01
Total CHOL/HDL Ratio: 3
Triglycerides: 70 mg/dL (ref 0.0–149.0)
VLDL: 14 mg/dL (ref 0.0–40.0)

## 2019-05-21 LAB — COMPREHENSIVE METABOLIC PANEL
ALT: 14 U/L (ref 0–53)
AST: 21 U/L (ref 0–37)
Albumin: 4.7 g/dL (ref 3.5–5.2)
Alkaline Phosphatase: 66 U/L (ref 39–117)
BUN: 7 mg/dL (ref 6–23)
CO2: 28 mEq/L (ref 19–32)
Calcium: 9.5 mg/dL (ref 8.4–10.5)
Chloride: 104 mEq/L (ref 96–112)
Creatinine, Ser: 0.8 mg/dL (ref 0.40–1.50)
GFR: 143.02 mL/min (ref >60.00)
Glucose, Bld: 91 mg/dL (ref 70–99)
Potassium: 3.4 mEq/L — ABNORMAL LOW (ref 3.5–5.1)
Sodium: 140 mEq/L (ref 135–145)
Total Bilirubin: 0.8 mg/dL (ref 0.2–1.2)
Total Protein: 7.3 g/dL (ref 6.0–8.3)

## 2019-05-21 LAB — TSH: TSH: 1.87 u[IU]/mL (ref 0.35–4.50)

## 2019-05-21 NOTE — Progress Notes (Addendum)
Subjective:    Patient ID: Jared Estrada, male    DOB: Sep 29, 1996, 23 y.o.   MRN: [id number]  Patient presents today for complete physical and eval of palpitation  Palpitations  This is a chronic problem. The current episode started more than 1 year ago. The problem occurs intermittently. The problem has been unchanged. Nothing aggravates the symptoms. Associated symptoms include chest fullness, dizziness and an irregular heartbeat. Pertinent negatives include no anxiety, chest pain, coughing, diaphoresis, fever, malaise/fatigue, nausea, near-syncope, numbness, shortness of breath, syncope, vomiting or weakness. He has tried nothing for the symptoms. Risk factors include being male. There is no history of anemia, anxiety, drug use, heart disease, hyperthyroidism or a valve disorder.  no palpitations or syncope during college years. Hx of multiple concussion without any syncope. current symptoms not related to any particular activity, each episode last about 6min, occurs at least once a month. No caffeine use, no ETOH use, no illicit drug use. No Fhx of sudden death or premature CAD.  Sexual History (orientation,birth control, marital status, STD):hetrosexual, sexually active, use of condoms  Depression/Suicide: Depression screen [facility] 2/9 05/21/2019 04/12/2017  Decreased Interest 0 0  Down, Depressed, Hopeless 0 0  PHQ - 2 Score 0 0   Vision:not needed per patient  Dental:uo to date  Immunizations: (TDAP, Hep C screen, Pneumovax, Influenza, zoster)  Health Maintenance  Topic Date Due  . Flu Shot  05/31/2019  . Tetanus Vaccine  04/13/2027  . HIV Screening  Completed   Diet:regular Weight:  Wt Readings from Last 3 Encounters:  05/21/19 186 lb 6.4 oz (84.6 kg)  04/12/17 185 lb 12.8 oz (84.3 kg)  11/01/14 179 lb 9.6 oz (81.5 kg)   Exercise:not at this time  Fall Risk: Fall Risk  05/21/2019 04/12/2017  Falls in the past year? 0 No   Medications and allergies reviewed with  patient and updated if appropriate.  Patient Active Problem List   Diagnosis Date Noted  . Hx of multiple concussions 05/21/2019  . Intermittent palpitations 05/21/2019    Current Outpatient Medications on File Prior to Visit  Medication Sig Dispense Refill  . acetaminophen (TYLENOL) 500 MG tablet Take 500 mg by mouth every 6 (six) hours as needed.    . cetirizine (ZYRTEC) 10 MG tablet Take 10 mg by mouth daily as needed. For seasonal allergies     No current facility-administered medications on file prior to visit.     Past Medical History:  Diagnosis Date  . Allergy   . Ankle sprain    bilateral, L 12/2011, R 04/2011    Past Surgical History:  Procedure Laterality Date  . CHALAZION EXCISION      Social History   Socioeconomic History  . Marital status: Single    Spouse name: n/a  . Number of children: 0  . Years of education: Not on file  . Highest education level: Not on file  Occupational History    Comment: [place]  Social Needs  . Financial resource strain: Not on file  . Food insecurity    Worry: Not on file    Inability: Not on file  . Transportation needs    Medical: Not on file    Non-medical: Not on file  Tobacco Use  . Smoking status: Never Smoker  . Smokeless tobacco: Never Used  Substance and Sexual Activity  . Alcohol use: No  . Drug use: No  . Sexual activity: Yes    Birth  control/protection: Condom  Lifestyle  . Physical activity    Days per week: Not on file    Minutes per session: Not on file  . Stress: Not on file  Relationships  . Social Musicianconnections    Talks on phone: Not on file    Gets together: Not on file    Attends religious service: Not on file    Active member of club or organization: Not on file    Attends meetings of clubs or organizations: Not on file    Relationship status: Not on file  Other Topics Concern  . Not on file  Social History Narrative   Graduated from [employer].   Attends [employer], where he plays for the football team.    Family History  Problem Relation Age of Onset  . Arthritis Sister        right shoulder, s/p injury  . Cancer Maternal Grandmother        breast  . Hypertension Maternal Grandmother   . Alcohol abuse Maternal Grandfather   . Hyperlipidemia Maternal Grandfather   . Alcohol abuse Paternal Grandfather   . Hypertension Father        Review of Systems  Constitutional: Negative for diaphoresis, fever and malaise/fatigue.  HENT: Negative.   Eyes: Negative.   Respiratory: Negative for cough and shortness of breath.   Cardiovascular: Positive for palpitations. Negative for chest pain, orthopnea, claudication, leg swelling, syncope and near-syncope.  Gastrointestinal: Negative.  Negative for nausea and vomiting.  Genitourinary: Negative.   Musculoskeletal: Negative.   Skin: Negative.   Neurological: Positive for dizziness. Negative for weakness and numbness.       Intermittent dizziness with rapid change in position (sitting to standing)  Endo/Heme/Allergies: Negative.   Psychiatric/Behavioral: Negative.  The patient is not nervous/anxious.     Objective:   Vitals:   05/21/19 1016  BP: 124/72  Pulse: 75  Temp: (!) 97.5 F (36.4 C)  SpO2: 98%    Body mass index is 22.69 kg/m.  ECG: S. Bradycardia.  Physical Examination:  Physical Exam Vitals signs reviewed.  Constitutional:      Appearance: Normal appearance. He is well-developed.  HENT:     Head: Normocephalic.     Right Ear: Tympanic membrane, ear canal and external ear normal.     Left Ear: Tympanic membrane, ear canal and external ear normal.     Nose: Nose normal.     Mouth/Throat:     Pharynx: No oropharyngeal exudate.  Eyes:     Extraocular Movements: Extraocular movements intact.     Conjunctiva/sclera: Conjunctivae normal.     Pupils: Pupils are equal, round, and reactive to light.  Neck:     Musculoskeletal: Normal range of motion and  neck supple.  Cardiovascular:     Rate and Rhythm: Normal rate and regular rhythm.     Pulses: Normal pulses.     Heart sounds: Normal heart sounds. No murmur. No friction rub. No gallop.   Pulmonary:     Effort: Pulmonary effort is normal. No respiratory distress.     Breath sounds: Normal breath sounds.  Chest:     Chest wall: No tenderness.  Abdominal:     General: Bowel sounds are normal.     Palpations: Abdomen is soft.  Musculoskeletal: Normal range of motion.     Right lower leg: No edema.     Left lower leg: No edema.  Skin:    General: Skin is warm and dry.  Neurological:  Mental Status: He is alert and oriented to person, place, and time.     Cranial Nerves: No cranial nerve deficit.     Motor: No weakness.     Gait: Gait normal.     Deep Tendon Reflexes: Reflexes are normal and symmetric.  Psychiatric:        Mood and Affect: Mood normal.        Behavior: Behavior normal.        Thought Content: Thought content normal.    ASSESSMENT and PLAN:  Jared Estrada was seen today for establish care.  Diagnoses and all orders for this visit:  Encounter for preventative adult health care exam with abnormal findings -     Lipid panel -     CBC -     TSH -     Comprehensive metabolic panel -     Comprehensive metabolic panel  Palpitations with regular cardiac rhythm -     EKG 12-Lead -     Cancel: ECHOCARDIOGRAM COMPLETE; Future -     ECHOCARDIOGRAM COMPLETE; Future  Encounter for lipid screening for cardiovascular disease -     Lipid panel    Hx of multiple concussions Played college football, graduated 2018 Reports 3incidents of concussion, no LOC, evaluated by sport's medicaine at the time per patient.     Problem List Items Addressed This Visit      Other   Intermittent palpitations    Other Visit Diagnoses    Encounter for preventative adult health care exam with abnormal findings    -  Primary   Relevant Orders   Lipid panel (Completed)   CBC  (Completed)   TSH (Completed)   Comprehensive metabolic panel (Completed)   Comprehensive metabolic panel   Encounter for lipid screening for cardiovascular disease       Relevant Orders   Lipid panel (Completed)      Follow up: Return if symptoms worsen or fail to improve.  Alysia Pennaharlotte Dayjah Selman, NP

## 2019-05-21 NOTE — Assessment & Plan Note (Signed)
Played college football, graduated 2018 Reports 3incidents of concussion, no LOC, evaluated by sport's medicaine at the time per patient.

## 2019-05-21 NOTE — Patient Instructions (Signed)
Go to lab for blood draw.  You will be contacted to schedule appt for echocardiogram.   Health Maintenance, Male Adopting a healthy lifestyle and getting preventive care are important in promoting health and wellness. Ask your health care provider about:  The right schedule for you to have regular tests and exams.  Things you can do on your own to prevent diseases and keep yourself healthy. What should I know about diet, weight, and exercise? Eat a healthy diet   Eat a diet that includes plenty of vegetables, fruits, low-fat dairy products, and lean protein.  Do not eat a lot of foods that are high in solid fats, added sugars, or sodium. Maintain a healthy weight Body mass index (BMI) is a measurement that can be used to identify possible weight problems. It estimates body fat based on height and weight. Your health care provider can help determine your BMI and help you achieve or maintain a healthy weight. Get regular exercise Get regular exercise. This is one of the most important things you can do for your health. Most adults should:  Exercise for at least 150 minutes each week. The exercise should increase your heart rate and make you sweat (moderate-intensity exercise).  Do strengthening exercises at least twice a week. This is in addition to the moderate-intensity exercise.  Spend less time sitting. Even light physical activity can be beneficial. Watch cholesterol and blood lipids Have your blood tested for lipids and cholesterol at 25 years of age, then have this test every 5 years. You may need to have your cholesterol levels checked more often if:  Your lipid or cholesterol levels are high.  You are older than 25 years of age.  You are at high risk for heart disease. What should I know about cancer screening? Many types of cancers can be detected early and may often be prevented. Depending on your health history and family history, you may need to have cancer screening  at various ages. This may include screening for:  Colorectal cancer.  Prostate cancer.  Skin cancer.  Lung cancer. What should I know about heart disease, diabetes, and high blood pressure? Blood pressure and heart disease  High blood pressure causes heart disease and increases the risk of stroke. This is more likely to develop in people who have high blood pressure readings, are of African descent, or are overweight.  Talk with your health care provider about your target blood pressure readings.  Have your blood pressure checked: ? Every 3-5 years if you are 30-24 years of age. ? Every year if you are 67 years old or older.  If you are between the ages of 31 and 50 and are a current or former smoker, ask your health care provider if you should have a one-time screening for abdominal aortic aneurysm (AAA). Diabetes Have regular diabetes screenings. This checks your fasting blood sugar level. Have the screening done:  Once every three years after age 39 if you are at a normal weight and have a low risk for diabetes.  More often and at a younger age if you are overweight or have a high risk for diabetes. What should I know about preventing infection? Hepatitis B If you have a higher risk for hepatitis B, you should be screened for this virus. Talk with your health care provider to find out if you are at risk for hepatitis B infection. Hepatitis C Blood testing is recommended for:  Everyone born from [birth year] through 1967.  Anyone  with known risk factors for hepatitis C. Sexually transmitted infections (STIs)  You should be screened each year for STIs, including gonorrhea and chlamydia, if: ? You are sexually active and are younger than 25 years of age. ? You are older than 25 years of age and your health care provider tells you that you are at risk for this type of infection. ? Your sexual activity has changed since you were last screened, and you are at increased risk for  chlamydia or gonorrhea. Ask your health care provider if you are at risk.  Ask your health care provider about whether you are at high risk for HIV. Your health care provider may recommend a prescription medicine to help prevent HIV infection. If you choose to take medicine to prevent HIV, you should first get tested for HIV. You should then be tested every 3 months for as long as you are taking the medicine. Follow these instructions at home: Lifestyle  Do not use any products that contain nicotine or tobacco, such as cigarettes, e-cigarettes, and chewing tobacco. If you need help quitting, ask your health care provider.  Do not use street drugs.  Do not share needles.  Ask your health care provider for help if you need support or information about quitting drugs. Alcohol use  Do not drink alcohol if your health care provider tells you not to drink.  If you drink alcohol: ? Limit how much you have to 0-2 drinks a day. ? Be aware of how much alcohol is in your drink. In the [place] one drink equals one 12 oz bottle of beer (355 mL), one 5 oz glass of wine (148 mL), or one 1 oz glass of hard liquor (44 mL). General instructions  Schedule regular health, dental, and eye exams.  Stay current with your vaccines.  Tell your health care provider if: ? You often feel depressed. ? You have ever been abused or do not feel safe at home. Summary  Adopting a healthy lifestyle and getting preventive care are important in promoting health and wellness.  Follow your health care provider's instructions about healthy diet, exercising, and getting tested or screened for diseases.  Follow your health care provider's instructions on monitoring your cholesterol and blood pressure. This information is not intended to replace advice given to you by your health care provider. Make sure you discuss any questions you have with your health care provider. Document Released: 04/13/2008 Document Revised:  10/09/2018 Document Reviewed: 10/09/2018 Elsevier Patient Education  2020 [employer]

## 2019-05-26 ENCOUNTER — Other Ambulatory Visit (HOSPITAL_COMMUNITY): Payer: BC Managed Care – PPO

## 2019-06-02 ENCOUNTER — Encounter (HOSPITAL_COMMUNITY): Payer: Self-pay

## 2019-06-02 ENCOUNTER — Other Ambulatory Visit (HOSPITAL_COMMUNITY): Payer: BC Managed Care – PPO

## 2019-06-11 ENCOUNTER — Other Ambulatory Visit: Payer: Self-pay

## 2019-06-11 ENCOUNTER — Ambulatory Visit (HOSPITAL_COMMUNITY): Payer: BC Managed Care – PPO | Attending: Cardiovascular Disease

## 2019-06-11 DIAGNOSIS — R002 Palpitations: Secondary | ICD-10-CM | POA: Diagnosis not present

## 2019-10-20 DIAGNOSIS — Z20828 Contact with and (suspected) exposure to other viral communicable diseases: Secondary | ICD-10-CM | POA: Diagnosis not present

## 2022-09-15 ENCOUNTER — Ambulatory Visit: Payer: Self-pay | Admitting: Nurse Practitioner

## 2022-09-15 ENCOUNTER — Telehealth: Payer: Self-pay

## 2022-09-15 NOTE — Telephone Encounter (Signed)
Pt no showed 09/15/22 appointment. Pt stated he did call around 7am thinking we were open. He was driving in from [place] and started to have car trouble. He rescheduled to November 15, 2022

## 2022-09-26 NOTE — Telephone Encounter (Signed)
pt called stating he had car trouble on the way from [facility] and would not make it, fee waived, pt has RS for Jan 2024

## 2022-11-15 ENCOUNTER — Encounter: Payer: Self-pay | Admitting: Nurse Practitioner

## 2022-11-15 ENCOUNTER — Ambulatory Visit: Payer: 59 | Admitting: Nurse Practitioner

## 2022-11-15 VITALS — BP 118/78 | HR 70 | Temp 98.2°F | Ht 76.0 in | Wt 205.2 lb

## 2022-11-15 DIAGNOSIS — B353 Tinea pedis: Secondary | ICD-10-CM

## 2022-11-15 DIAGNOSIS — B070 Plantar wart: Secondary | ICD-10-CM

## 2022-11-15 DIAGNOSIS — Z23 Encounter for immunization: Secondary | ICD-10-CM | POA: Diagnosis not present

## 2022-11-15 MED ORDER — MICONAZOLE NITRATE 2 % EX POWD: Freq: Two times a day (BID) | CUTANEOUS | 0 refills | Status: DC

## 2022-11-15 MED ORDER — PODOFILOX 0.5 % EX SOLN: CUTANEOUS | 0 refills | Status: DC

## 2022-11-15 NOTE — Patient Instructions (Signed)
Use miconazole powder OTC for fungus between toes. Soak feet in water and vinegar mix daily x 15-60mins. Use podofilox gel on wart as prescribed.  Plantar Warts Warts are small growths on the skin. When they occur on the underside (sole) of the foot, they are called plantar warts. Plantar warts often occur in groups, with several small warts around a larger wart. They tend to develop on the heel or the ball of the foot. They may grow into the deeper layers of skin or rise above the surface of the skin. Most warts are not painful, and they usually do not cause problems. However, plantar warts may cause pain when you walk because pressure is applied to them. Plantar warts may spread to other areas of the sole. They can also spread to other areas of the body through direct and indirect contact. Warts often go away on their own in time. Various treatments may be done if needed or desired. What are the causes? Plantar warts are caused by a type of virus that is called human papillomavirus (HPV). Walking barefoot can cause exposure to the virus, especially if your feet are wet. HPV attacks a break in the skin of the foot. What increases the risk? You are more likely to develop this condition if you: Are between 65 and 48 years of age. Use public showers or locker rooms. Have a weakened body defense system (immune system). What are the signs or symptoms? Common symptoms of this condition include: Flat or slightly raised growths that have a rough surface and look similar to a callus. Pain when you use your foot to support your body weight. How is this diagnosed? A plantar wart can usually be diagnosed from its appearance. In some cases, a tissue sample may be removed (biopsy) to be looked at under a microscope. How is this treated? In many cases, warts do not need treatment. Without treatment, they often go away with time. If treatment is needed or desired, options may include: Applying medicated  solutions, creams, or patches to the wart. These may be over-the-counter or prescription medicines that make the skin soft so that layers will gradually shed away. In many cases, the medicine is applied one or two times a day and covered with a bandage. Freezing the wart with liquid nitrogen (cryotherapy). Burning the wart with: Laser treatment. An electrified probe (electrocautery). Injecting a medicine (Candida antigen) into the wart to help the body's immune system fight off the wart. Having surgery to remove the wart. Putting duct tape over the top of the wart (occlusion). You will leave the tape in place for as long as told by your health care provider, and then you will replace it with a new strip of tape. This is done until the wart goes away. Repeat treatment may be needed if you choose to remove warts. Warts sometimes go away and come back again. Follow these instructions at home: Apply medicated creams or solutions only as told by your health care provider. This may involve: Soaking the affected area in warm water. Removing the top layer of softened skin before you apply the medicine. A pumice stone works well for removing the skin. Applying a bandage over the affected area after you apply the medicine. Repeating the process daily or as told by your health care provider. Do not scratch or pick at a wart. Wash your hands after you touch a wart. If a wart is painful, try covering it with a bandage that has a hole  in the middle. This helps to take pressure off the wart. Keep all follow-up visits as told by your health care provider. This is important. How is this prevented? Take these actions to help prevent warts: Wear shoes and socks. Change your socks daily. Keep your feet clean and dry. Do not walk barefoot in shared locker rooms, shower areas, or swimming pools. Check your feet regularly. Avoid direct contact with warts on other people. Contact a health care provider if: Your  warts do not improve after treatment. You have redness, swelling, or pain at the site of a wart. You have bleeding from a wart that does not stop with light pressure. You have diabetes and you develop a wart. Summary Warts are small growths on the skin. When they occur on the underside (sole) of the foot, they are called plantar warts. In many cases, warts do not need treatment. Without treatment, they often go away with time. Apply medicated creams or solutions only as told by your health care provider. Do not scratch or pick at a wart. Wash your hands after you touch a wart. Keep all follow-up visits as told by your health care provider. This is important. This information is not intended to replace advice given to you by your health care provider. Make sure you discuss any questions you have with your health care provider. Document Revised: 02/03/2021 Document Reviewed: 02/03/2021 Elsevier Patient Education  [place].

## 2022-11-15 NOTE — Assessment & Plan Note (Signed)
With onychomycosis He declined use of oral antifungal at this time Advised to use topical miconazole powder and need for adequate foot care.

## 2022-11-15 NOTE — Assessment & Plan Note (Signed)
Resolved per patient Normal ECHo and ECG

## 2022-11-15 NOTE — Progress Notes (Addendum)
                Established Patient Visit  Patient: Jared Estrada   DOB: February 23, 1996   27 y.o. Male  MRN: [id number] Visit Date: 11/15/2022  Subjective:    Chief Complaint  Patient presents with  . 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 Worsening and associated with pain.  Sent podofilox solution  Tinea pedis of right foot With onychomycosis He declined use of oral antifungal at this time Advised to use topical miconazole powder and need for adequate foot care.  Reviewed medical, surgical, and social history today  Medications: Outpatient Medications Prior to Visit  Medication Sig  . acetaminophen (TYLENOL) 500 MG tablet Take 500 mg by mouth every 6 (six) hours as needed.  . cetirizine (ZYRTEC) 10 MG tablet Take 10 mg by mouth daily as needed. For seasonal allergies   No facility-administered medications prior to visit.   Reviewed past medical and social history.   ROS per HPI above      Objective:  BP 118/78 (BP Location: Right Arm, Patient Position: Sitting, Cuff Size: Normal)   Pulse 70   Temp 98.2 F (36.8 C) (Temporal)   Ht 6\' 4"  (1.93 m)   Wt 205 lb 3.2 oz (93.1 kg)   SpO2 98%   BMI 24.98 kg/m      Physical Exam Cardiovascular:     Pulses:          Dorsalis pedis pulses are 2+ on the right side and 2+ on the left side.       Posterior tibial pulses are 2+ on the right side and 2+ on the left side.  Musculoskeletal:       Feet:  Feet:     Right foot:     Skin integrity: Callus present. No skin breakdown or erythema.     Toenail Condition: Right toenails are abnormally thick. Fungal disease present.    Left foot:     Toenail Condition: Left toenails are normal.    No results found for any visits on 11/15/22.    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 Worsening and associated with pain.  Sent podofilox solution      Relevant Medications   miconazole (MICOTIN) 2 % powder   podofilox (CONDYLOX) 0.5 % external solution   Tinea pedis of right foot    With onychomycosis He declined use of oral antifungal at this time Advised to use topical miconazole powder and need for adequate foot care.      Relevant Medications   miconazole (MICOTIN) 2 % powder   Other Visit Diagnoses     Need for immunization against influenza       Relevant Orders   Flu Vaccine QUAD 6+ mos PF IM (Fluarix Quad PF) (Completed)      Return in about 4 weeks (around 12/13/2022) for CPE (fasting).     Wilfred Lacy, NP

## 2022-11-15 NOTE — Assessment & Plan Note (Signed)
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 Worsening and associated with pain.  Sent podofilox solution

## 2023-01-16 ENCOUNTER — Telehealth: Payer: Self-pay | Admitting: Nurse Practitioner

## 2023-01-16 ENCOUNTER — Encounter: Payer: 59 | Admitting: Nurse Practitioner

## 2023-01-16 NOTE — Telephone Encounter (Signed)
3.19.24 no show letter sent

## 2023-01-18 NOTE — Telephone Encounter (Signed)
1st no show, fee waived, letter sent to reschedule/notify of policy 

## 2023-12-18 ENCOUNTER — Other Ambulatory Visit: Payer: Self-pay | Admitting: Nurse Practitioner

## 2023-12-18 DIAGNOSIS — B070 Plantar wart: Secondary | ICD-10-CM

## 2023-12-18 DIAGNOSIS — Z3141 Encounter for fertility testing: Secondary | ICD-10-CM | POA: Diagnosis not present

## 2023-12-18 NOTE — Telephone Encounter (Signed)
  Chief Complaint: Medication Refill Request Frequency: N.A Pertinent Negatives: Patient denies symptoms at this time.  Disposition: [] ED /[] Urgent Care (no appt availability in office) / [] Appointment(In office/virtual)/ []  [facility]/ [] Home Care/ [] Refused Recommended Disposition /[] [facility]/ [x]  Follow-up with PCP Additional Notes: [facility] is calling regarding requesting a refill on Podofilox (CONDYLOX). The patient denies any symptoms during time of telephonic triage.

## 2023-12-18 NOTE — Telephone Encounter (Signed)
 Please advise, see below.  LOV:11/15/2022 NOV: 01/16/2024

## 2023-12-18 NOTE — Telephone Encounter (Signed)
 This RN attempted return call attempt #1 about refill medication. No answer. LVM.  Copied from CRM [id number]. Topic: Clinical - Medication Refill >> Dec 18, 2023 12:57 PM Rodman Pickle T wrote: Most Recent Primary Care Visit:  Provider: Anne Ng  Department: [facility]  Visit Type: NEW PATIENT  Date: 11/15/2022  Medication: patient is needing the medication that was prescribed to him last time he was seen for colists patient was not sure what was the name of the medication can patient get a call back to verify the name of the medication   Has the patient contacted their pharmacy? No (Agent: If no, request that the patient contact the pharmacy for the refill. If patient does not wish to contact the pharmacy document the reason why and proceed with request.) (Agent: If yes, when and what did the pharmacy advise?)  Is this the correct pharmacy for this prescription? Yes If no, delete pharmacy and type the correct one.  This is the patient's preferred pharmacy:  [id number] G[place] Otto, [place] - [address] AT [facility] OF [facility] & [address] [place] [address] Phone: [phone] Fax: [phone]  [id number] [place]a Hacking, [place] - [address] AT [facility] OF [facility] & [address] [place] [address] Phone: [phone] Fax: [phone]   Has the prescription been filled recently? No  Is the patient out of the medication? Yes  Has the patient been seen for an appointment in the last year OR does the patient have an upcoming appointment? Yes  Can we respond through MyChart? No  Agent: Please be advised that Rx refills may take up to 3 business days. We ask that you follow-up with your pharmacy.

## 2023-12-19 DIAGNOSIS — Z3141 Encounter for fertility testing: Secondary | ICD-10-CM | POA: Diagnosis not present

## 2023-12-20 ENCOUNTER — Other Ambulatory Visit: Payer: Self-pay | Admitting: Nurse Practitioner

## 2023-12-20 DIAGNOSIS — B070 Plantar wart: Secondary | ICD-10-CM

## 2023-12-20 NOTE — Telephone Encounter (Signed)
 Left voicemail for patient to return call.

## 2023-12-21 MED ORDER — PODOFILOX 0.5 % EX SOLN: CUTANEOUS | 0 refills | Status: AC

## 2023-12-21 NOTE — Telephone Encounter (Signed)
 I called patient and notified him that Rx was approved and sent to pharmacy.

## 2023-12-21 NOTE — Telephone Encounter (Signed)
 I called and spoke to patient and he is requesting Rx of Podofilox for wart on foot.

## 2024-01-01 ENCOUNTER — Encounter: Payer: Self-pay | Admitting: Nurse Practitioner

## 2024-01-01 ENCOUNTER — Ambulatory Visit (INDEPENDENT_AMBULATORY_CARE_PROVIDER_SITE_OTHER): Payer: 59 | Admitting: Nurse Practitioner

## 2024-01-01 VITALS — BP 122/68 | HR 62 | Temp 98.4°F | Ht 76.0 in | Wt 203.0 lb

## 2024-01-01 DIAGNOSIS — Z23 Encounter for immunization: Secondary | ICD-10-CM

## 2024-01-01 DIAGNOSIS — Z0000 Encounter for general adult medical examination without abnormal findings: Secondary | ICD-10-CM | POA: Diagnosis not present

## 2024-01-01 LAB — COMPREHENSIVE METABOLIC PANEL
ALT: 12 U/L (ref 0–53)
AST: 18 U/L (ref 0–37)
Albumin: 4.6 g/dL (ref 3.5–5.2)
Alkaline Phosphatase: 62 U/L (ref 39–117)
BUN: 10 mg/dL (ref 6–23)
CO2: 31 meq/L (ref 19–32)
Calcium: 10 mg/dL (ref 8.4–10.5)
Chloride: 102 meq/L (ref 96–112)
Creatinine, Ser: 1.03 mg/dL (ref 0.40–1.50)
GFR: 98.39 mL/min (ref >60.00)
Glucose, Bld: 91 mg/dL (ref 70–99)
Potassium: 4.4 meq/L (ref 3.5–5.1)
Sodium: 137 meq/L (ref 135–145)
Total Bilirubin: 0.6 mg/dL (ref 0.2–1.2)
Total Protein: 7.7 g/dL (ref 6.0–8.3)

## 2024-01-01 LAB — CBC
HCT: 46.4 % (ref 39.0–52.0)
Hemoglobin: 15.4 g/dL (ref 13.0–17.0)
MCHC: 33.2 g/dL (ref 30.0–36.0)
MCV: 84.7 fl (ref 78.0–100.0)
Platelets: 242 10*3/uL (ref 150.0–400.0)
RBC: 5.48 Mil/uL (ref 4.22–5.81)
RDW: 12.8 % (ref 11.5–15.5)
WBC: 5.5 10*3/uL (ref 4.0–10.5)

## 2024-01-01 NOTE — Patient Instructions (Addendum)
 Go to lab Maintain Heart healthy diet and daily exercise. Schedule nurse visit for 3rd gardasil vaccine in 3months  Preventive Care 93-30 Years Old, Male Preventive care refers to lifestyle choices and visits with your health care provider that can promote health and wellness. Preventive care visits are also called wellness exams. What can I expect for my preventive care visit? Counseling During your preventive care visit, your health care provider may ask about your: Medical history, including: Past medical problems. Family medical history. Current health, including: Emotional well-being. Home life and relationship well-being. Sexual activity. Lifestyle, including: Alcohol, nicotine or tobacco, and drug use. Access to firearms. Diet, exercise, and sleep habits. Safety issues such as seatbelt and bike helmet use. Sunscreen use. Work and work Astronomer. Physical exam Your health care provider may check your: Height and weight. These may be used to calculate your BMI (body mass index). BMI is a measurement that tells if you are at a healthy weight. Waist circumference. This measures the distance around your waistline. This measurement also tells if you are at a healthy weight and may help predict your risk of certain diseases, such as type 2 diabetes and high blood pressure. Heart rate and blood pressure. Body temperature. Skin for abnormal spots. What immunizations do I need?  Vaccines are usually given at various ages, according to a schedule. Your health care provider will recommend vaccines for you based on your age, medical history, and lifestyle or other factors, such as travel or where you work. What tests do I need? Screening Your health care provider may recommend screening tests for certain conditions. This may include: Lipid and cholesterol levels. Diabetes screening. This is done by checking your blood sugar (glucose) after you have not eaten for a while  (fasting). Hepatitis B test. Hepatitis C test. HIV (human immunodeficiency virus) test. STI (sexually transmitted infection) testing, if you are at risk. Talk with your health care provider about your test results, treatment options, and if necessary, the need for more tests. Follow these instructions at home: Eating and drinking  Eat a healthy diet that includes fresh fruits and vegetables, whole grains, lean protein, and low-fat dairy products. Drink enough fluid to keep your urine pale yellow. Take vitamin and mineral supplements as recommended by your health care provider. Do not drink alcohol if your health care provider tells you not to drink. If you drink alcohol: Limit how much you have to 0-2 drinks a day. Know how much alcohol is in your drink. In the [place] one drink equals one 12 oz bottle of beer (355 mL), one 5 oz glass of wine (148 mL), or one 1 oz glass of hard liquor (44 mL). Lifestyle Brush your teeth every morning and night with fluoride toothpaste. Floss one time each day. Exercise for at least 30 minutes 5 or more days each week. Do not use any products that contain nicotine or tobacco. These products include cigarettes, chewing tobacco, and vaping devices, such as e-cigarettes. If you need help quitting, ask your health care provider. Do not use drugs. If you are sexually active, practice safe sex. Use a condom or other form of protection to prevent STIs. Find healthy ways to manage stress, such as: Meditation, yoga, or listening to music. Journaling. Talking to a trusted person. Spending time with friends and family. Minimize exposure to UV radiation to reduce your risk of skin cancer. Safety Always wear your seat belt while driving or riding in a vehicle. Do not drive: If you have  been drinking alcohol. Do not ride with someone who has been drinking. If you have been using any mind-altering substances or drugs. While texting. When you are tired or  distracted. Wear a helmet and other protective equipment during sports activities. If you have firearms in your house, make sure you follow all gun safety procedures. Seek help if you have been physically or sexually abused. What's next? Go to your health care provider once a year for an annual wellness visit. Ask your health care provider how often you should have your eyes and teeth checked. Stay up to date on all vaccines. This information is not intended to replace advice given to you by your health care provider. Make sure you discuss any questions you have with your health care provider. Document Revised: 04/13/2021 Document Reviewed: 04/13/2021 Elsevier Patient Education  2024 [employer].

## 2024-01-01 NOTE — Progress Notes (Signed)
 Complete physical exam  Patient: Jared Estrada   DOB: February 08, 1996   27 y.o. Male  MRN: [id number] Visit Date: 01/01/2024  Subjective:    Chief Complaint  Patient presents with   Annual Exam    Physical form    Jared Estrada is a 28 y.o. male who presents today for a complete physical exam. He reports consuming a general diet. Gym/ health club routine includes cardio and high impact aerobics . He generally feels well. He reports sleeping well. He does not have additional problems to discuss today.  Vision:Yes Dental:Within Last 6 months STD Screen:No  BP Readings from Last 3 Encounters:  01/01/24 122/68  11/15/22 118/78  05/21/19 124/72   Wt Readings from Last 3 Encounters:  01/01/24 203 lb (92.1 kg)  11/15/22 205 lb 3.2 oz (93.1 kg)  05/21/19 186 lb 6.4 oz (84.6 kg)   Most recent fall risk assessment:    01/01/2024    9:16 AM  Fall Risk   Falls in the past year? 0  Number falls in past yr: 0  Injury with Fall? 0  Risk for fall due to : No Fall Risks  Follow up Falls prevention discussed;Falls evaluation completed   Depression screen:Yes - No Depression Most recent depression screenings:    01/01/2024    9:16 AM 11/15/2022   11:23 AM  PHQ 2/9 Scores  PHQ - 2 Score 0 2  PHQ- 9 Score 0 4   HPI  No problem-specific Assessment & Plan notes found for this encounter.  Past Medical History:  Diagnosis Date   Allergy    Ankle sprain    bilateral, L 12/2011, R 04/2011   Intermittent palpitations 05/21/2019   Past Surgical History:  Procedure Laterality Date   CHALAZION EXCISION     Social History   Socioeconomic History   Marital status: Single    Spouse name: n/a   Number of children: 0   Years of education: Not on file   Highest education level: Not on file  Occupational History    Comment: Social research officer, government    Comment: Hydrographic surveyor  Tobacco Use   Smoking status: Never   Smokeless tobacco: Never  Vaping Use   Vaping status: Never Used  Substance and  Sexual Activity   Alcohol use: No   Drug use: No   Sexual activity: Yes    Birth control/protection: Condom  Other Topics Concern   Not on file  Social History Narrative   Graduated from [employer].  Attends [employer], where he plays for the football team.   Social Drivers of Health   Financial Resource Strain: Not on file  Food Insecurity: Not on file  Transportation Needs: Not on file  Physical Activity: Not on file  Stress: Not on file  Social Connections: Not on file  Intimate Partner Violence: Not on file   Family Status  Relation Name Status   Sister  Alive   MGM  Alive   MGF  Alive   PGF  Alive   Mother  Alive   Father  Alive   PGM  Alive  No partnership data on file   Family History  Problem Relation Age of Onset   Arthritis Sister        right shoulder, s/p injury   Cancer Maternal Grandmother        breast   Hypertension Maternal Grandmother    Alcohol abuse Maternal Grandfather    Hyperlipidemia Maternal Grandfather    Alcohol abuse  Paternal Grandfather    Hypertension Father    No Known Allergies  Patient Care Team: Artemis Koller, Bonna Gains, NP as PCP - General (Internal Medicine)   Medications: Outpatient Medications Prior to Visit  Medication Sig   acetaminophen (TYLENOL) 500 MG tablet Take 500 mg by mouth every 6 (six) hours as needed.   Arginine 500 MG CAPS Take 1 capsule by mouth daily at 6 (six) AM.   cetirizine (ZYRTEC) 10 MG tablet Take 10 mg by mouth daily as needed. For seasonal allergies   Coenzyme Q10 (COQ-10) 400 MG CAPS Take 1 capsule by mouth daily at 6 (six) AM.   podofilox (CONDYLOX) 0.5 % external solution Apply twice a day x 3 consecutive days, then weekly x 4weeks   [DISCONTINUED] miconazole (MICOTIN) 2 % powder Apply topically 2 (two) times daily. (Patient not taking: Reported on 01/01/2024)   No facility-administered medications prior to visit.   Review of Systems  Constitutional:  Negative for activity change, appetite change  and unexpected weight change.  Respiratory: Negative.    Cardiovascular: Negative.   Gastrointestinal: Negative.   Endocrine: Negative for cold intolerance and heat intolerance.  Genitourinary: Negative.   Musculoskeletal: Negative.   Skin: Negative.   Neurological: Negative.   Hematological: Negative.   Psychiatric/Behavioral:  Negative for behavioral problems, decreased concentration, dysphoric mood, hallucinations, self-injury, sleep disturbance and suicidal ideas. The patient is not nervous/anxious.        Objective:  BP 122/68 (BP Location: Left Arm, Patient Position: Sitting, Cuff Size: Normal)   Pulse 62   Temp 98.4 F (36.9 C) (Temporal)   Ht 6\' 4"  (1.93 m)   Wt 203 lb (92.1 kg)   SpO2 97%   BMI 24.71 kg/m     Physical Exam Vitals and nursing note reviewed.  Constitutional:      General: He is not in acute distress. HENT:     Right Ear: Tympanic membrane, ear canal and external ear normal.     Left Ear: Tympanic membrane, ear canal and external ear normal.     Nose: Nose normal.  Eyes:     Extraocular Movements: Extraocular movements intact.     Conjunctiva/sclera: Conjunctivae normal.     Pupils: Pupils are equal, round, and reactive to light.  Neck:     Thyroid: No thyroid mass, thyromegaly or thyroid tenderness.  Cardiovascular:     Rate and Rhythm: Normal rate and regular rhythm.     Pulses: Normal pulses.     Heart sounds: Normal heart sounds.  Pulmonary:     Effort: Pulmonary effort is normal.     Breath sounds: Normal breath sounds.  Abdominal:     General: Bowel sounds are normal.     Palpations: Abdomen is soft.  Musculoskeletal:        General: Normal range of motion.     Cervical back: Normal range of motion and neck supple.     Right lower leg: No edema.     Left lower leg: No edema.  Lymphadenopathy:     Cervical: No cervical adenopathy.  Skin:    General: Skin is warm and dry.  Neurological:     Mental Status: He is alert and oriented  to person, place, and time.     Cranial Nerves: No cranial nerve deficit.  Psychiatric:        Mood and Affect: Mood normal.        Behavior: Behavior normal.        Thought Content: Thought  content normal.      No results found for any visits on 01/01/24.    Assessment & Plan:    Routine Health Maintenance and Physical Exam  Immunization History  Administered Date(s) Administered   Dtap, Unspecified 01/04/1995, 02/23/1995, 04/20/1995, 01/31/1996, 12/16/1998   HIB, Unspecified 01/05/1995, 02/23/1995, 04/20/1995, 01/31/1996   HPV Quadrivalent 07/19/2011   Hep A, Unspecified 08/16/2005   Hep B, Unspecified May 07, 1994, 01/05/1995, 04/20/1995   Hepatitis A, Ped/Adol-2 Dose 03/27/2007   Influenza,inj,Quad PF,6+ Mos 11/15/2022   Influenza-Unspecified 08/16/2005   MMR 11/20/1996, 12/16/1998   Meningococcal Conjugate 03/27/2007   Moderna Sars-Covid-2 Vaccination 03/08/2020, 04/10/2020   PPD Test 08/11/2021   Polio, Unspecified 01/04/1995, 02/22/1995, 04/20/1995, 12/16/1998   Tdap 03/27/2007, 04/12/2017   Varicella 12/07/2003, 03/27/2007   Health Maintenance  Topic Date Due   HPV VACCINES (2 - Male 3-dose series) 08/16/2011   COVID-19 Vaccine (3 - 2024-25 season) 01/17/2024 (Originally 07/01/2023)   INFLUENZA VACCINE  01/28/2024 (Originally 05/31/2023)   Hepatitis C Screening  12/31/2024 (Originally 10/24/2012)   DTaP/Tdap/Td (8 - Td or Tdap) 04/13/2027   HIV Screening  Completed   Discussed health benefits of physical activity, and encouraged him to engage in regular exercise appropriate for his age and condition.  Problem List Items Addressed This Visit   None Visit Diagnoses       Preventative health care    -  Primary   Relevant Orders   CBC   Comprehensive metabolic panel     Immunization due       Relevant Orders   HPV 9-valent vaccine,Recombinat     Work form completed today and returned to patient  Return in about 1 year (around 12/31/2024) for CPE (fasting).      Alysia Penna, NP

## 2024-01-16 ENCOUNTER — Encounter: Payer: 59 | Admitting: Nurse Practitioner

## 2024-04-02 ENCOUNTER — Ambulatory Visit

## 2024-04-02 ENCOUNTER — Ambulatory Visit (INDEPENDENT_AMBULATORY_CARE_PROVIDER_SITE_OTHER)

## 2024-04-02 DIAGNOSIS — Z23 Encounter for immunization: Secondary | ICD-10-CM

## 2024-04-02 NOTE — Progress Notes (Addendum)
 Patient is in office today for a nurse visit HPV vaccination (3rd dosage). Patient injection was given in the left deltoid and tolerated well with no swelling or pain at site.

## 2025-01-02 ENCOUNTER — Encounter: Admitting: Nurse Practitioner
